# Patient Record
Sex: Female | Born: 1975 | Race: White | Hispanic: No | Marital: Married
Health system: Southern US, Community
[De-identification: ages and names within clinical notes are randomized; demographics above are authoritative.]

## PROBLEM LIST (undated history)

## (undated) DIAGNOSIS — Q059 Spina bifida, unspecified: Secondary | ICD-10-CM

## (undated) HISTORY — PX: SPINE SURGERY: SHX786

## (undated) HISTORY — PX: DILATION AND CURETTAGE OF UTERUS: SHX78

---

## 2010-04-18 ENCOUNTER — Ambulatory Visit (HOSPITAL_COMMUNITY): Admission: RE | Admit: 2010-04-18 | Discharge: 2010-04-18 | Payer: Self-pay | Admitting: Specialist

## 2011-09-13 IMAGING — US US OB DETAIL+14 WK
1 series · 14 of 28 positions shown · non-contrast
Comparison: none

OBSTETRICAL ULTRASOUND:
 This ultrasound was performed in The [HOSPITAL], and the [facility] report will be stored to [REDACTED] PACS.  This report is also available in [HOSPITAL]?s accessANYware.

[Series 1: us ob detail+14 wk · 85 acquisitions, 14 frames shown]
[im 4/85]
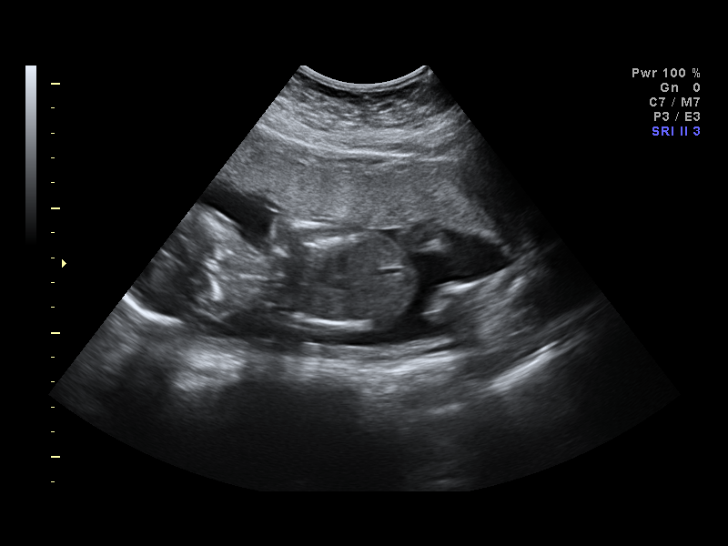
[im 10/85]
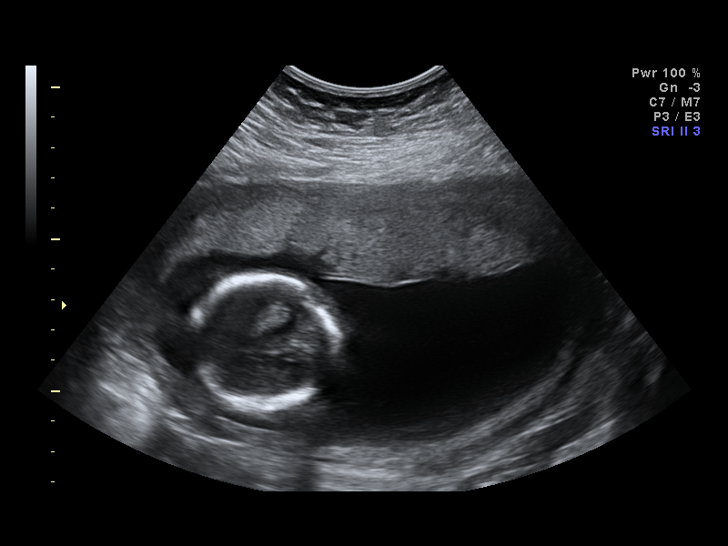
[im 16/85]
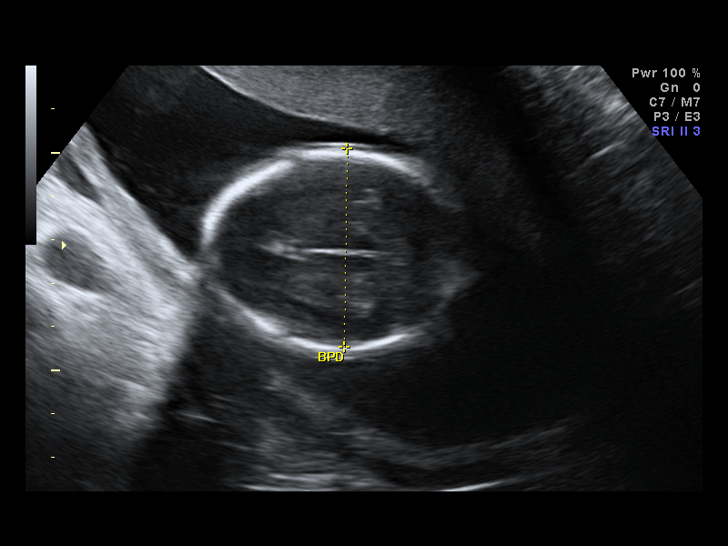
[im 22/85]
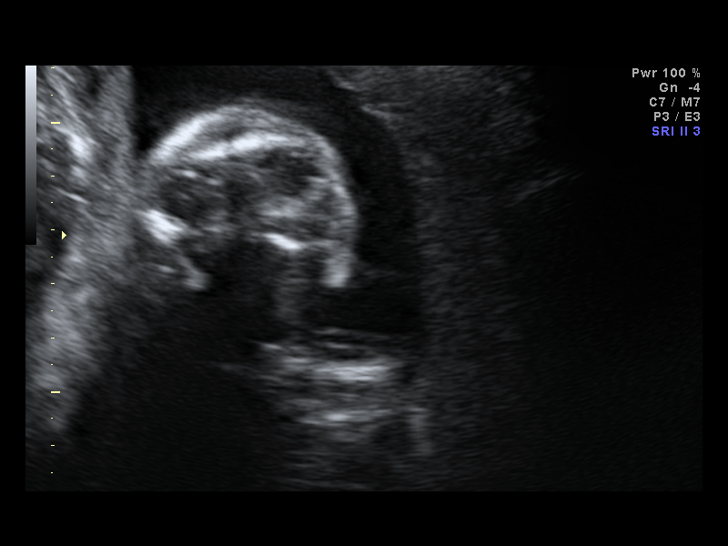
[im 29/85]
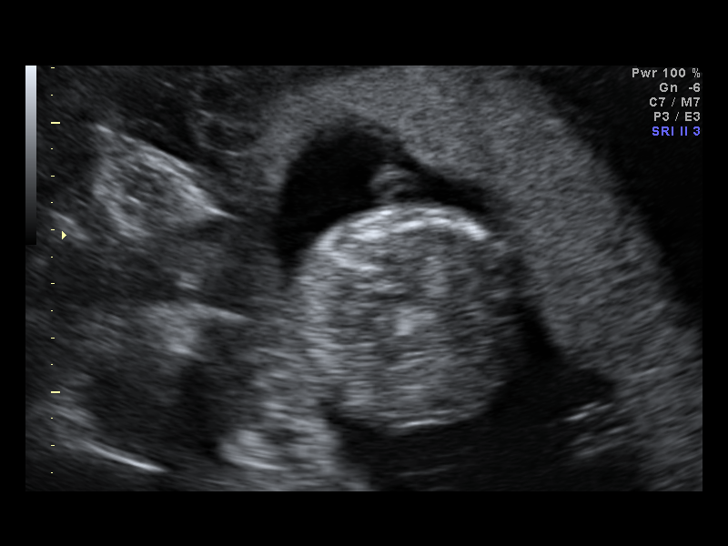
[im 35/85]
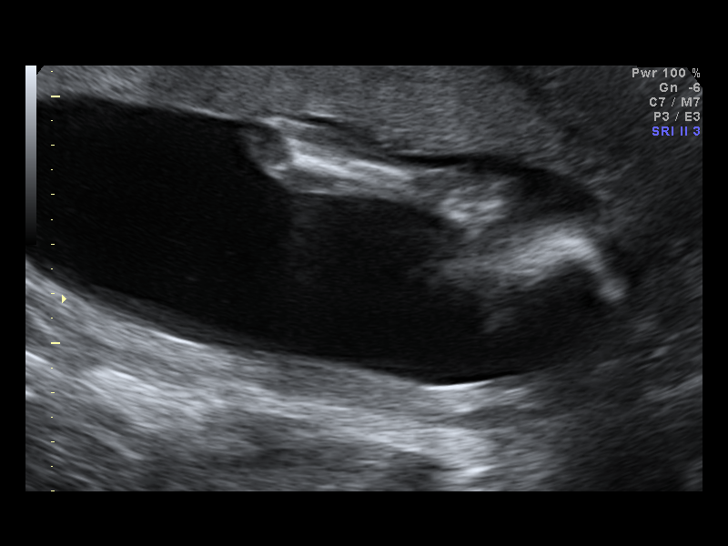
[im 41/85]
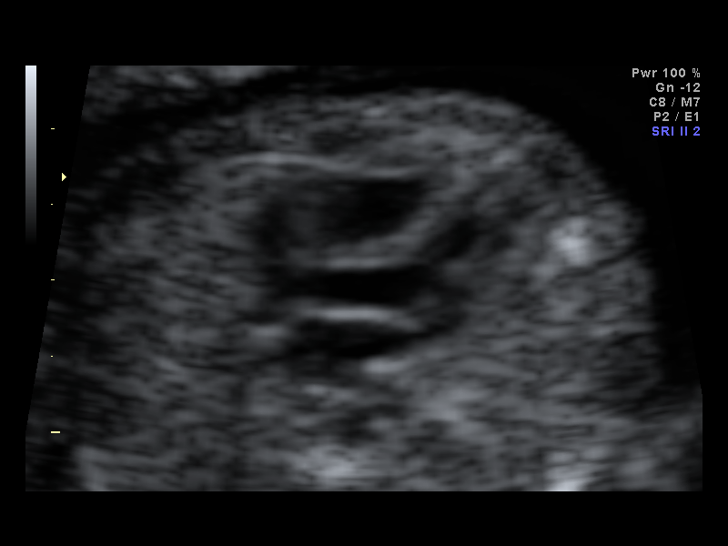
[im 47/85]
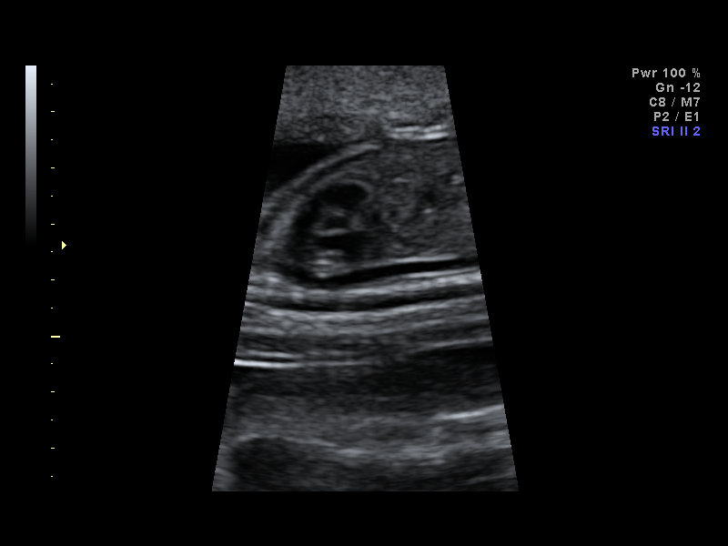
[im 53/85]
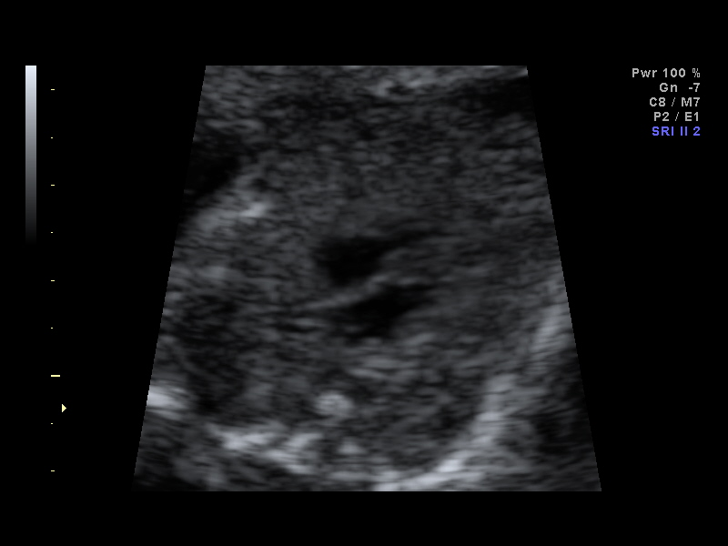
[im 60/85]
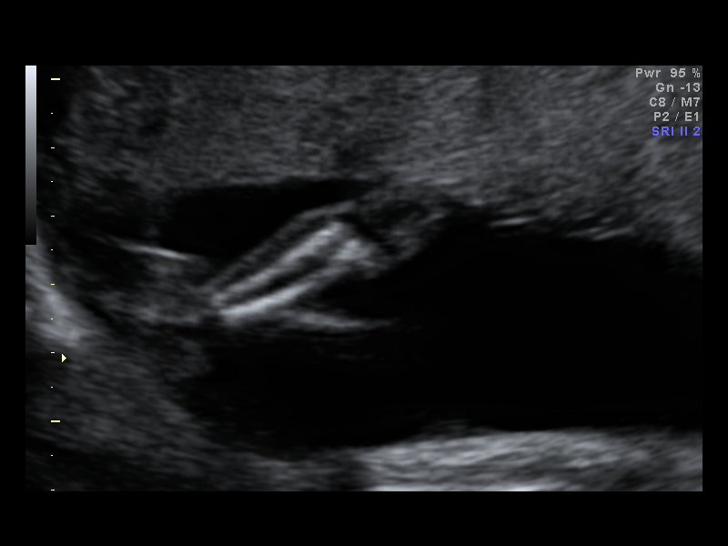
[im 66/85]
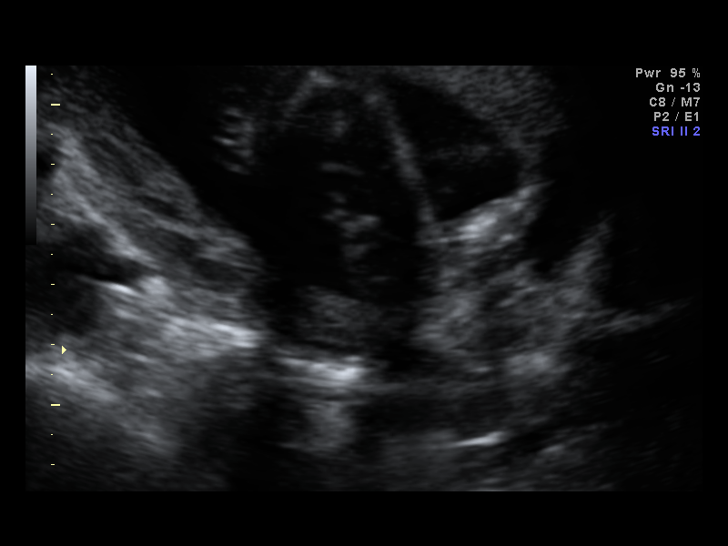
[im 72/85]
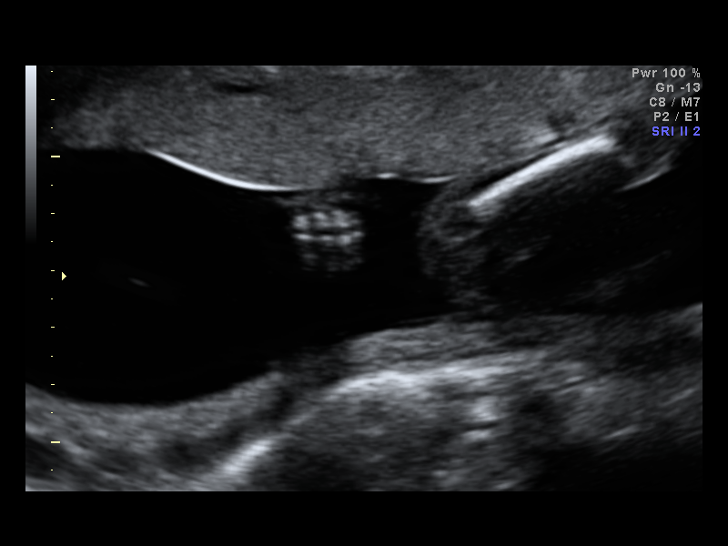
[im 78/85]
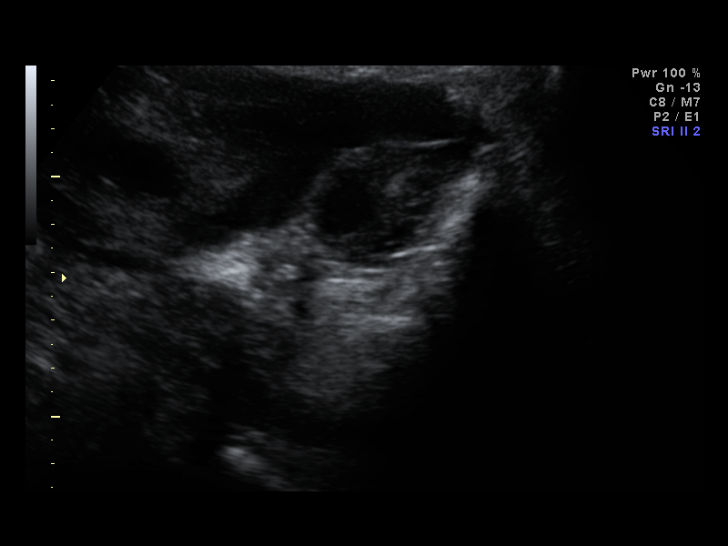
[im 85/85]
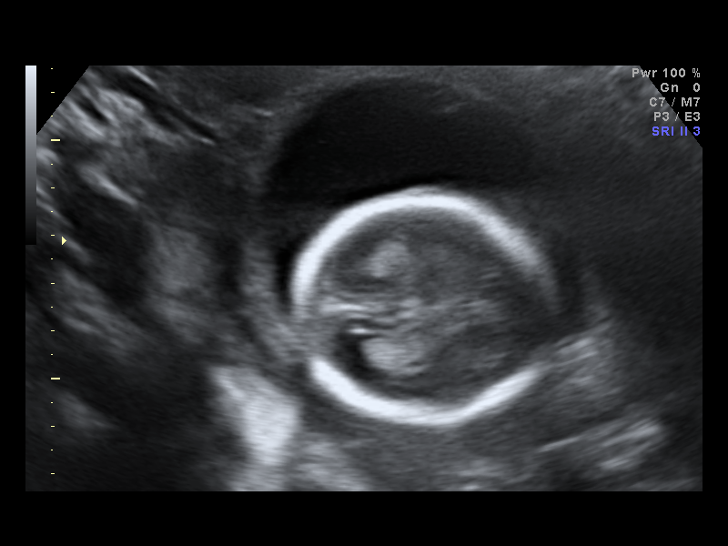

[14 of 28 positions shown; findings below may reference images not displayed]

IMPRESSION: [facility] has also been faxed to the ordering physician.

## 2014-03-14 ENCOUNTER — Other Ambulatory Visit: Payer: Self-pay

## 2014-04-13 ENCOUNTER — Other Ambulatory Visit (HOSPITAL_COMMUNITY): Payer: Self-pay | Admitting: Specialist

## 2014-04-13 ENCOUNTER — Encounter (HOSPITAL_COMMUNITY): Payer: Self-pay | Admitting: Specialist

## 2014-04-13 DIAGNOSIS — O09522 Supervision of elderly multigravida, second trimester: Secondary | ICD-10-CM

## 2014-04-13 DIAGNOSIS — Z3689 Encounter for other specified antenatal screening: Secondary | ICD-10-CM

## 2014-04-13 DIAGNOSIS — Q059 Spina bifida, unspecified: Secondary | ICD-10-CM

## 2014-05-26 ENCOUNTER — Other Ambulatory Visit (HOSPITAL_COMMUNITY): Payer: BC Managed Care – PPO

## 2014-05-26 ENCOUNTER — Encounter (HOSPITAL_COMMUNITY): Payer: Self-pay

## 2014-05-26 ENCOUNTER — Ambulatory Visit (HOSPITAL_COMMUNITY)
Admission: RE | Admit: 2014-05-26 | Discharge: 2014-05-26 | Disposition: A | Discharge status: Home / Self Care | Payer: BC Managed Care – PPO | Source: Ambulatory Visit | Attending: Specialist | Admitting: Specialist

## 2014-05-26 ENCOUNTER — Ambulatory Visit (HOSPITAL_COMMUNITY): Admission: RE | Admit: 2014-05-26 | Payer: BC Managed Care – PPO | Source: Ambulatory Visit

## 2014-05-26 DIAGNOSIS — Q059 Spina bifida, unspecified: Secondary | ICD-10-CM | POA: Diagnosis not present

## 2014-05-26 DIAGNOSIS — Z3689 Encounter for other specified antenatal screening: Secondary | ICD-10-CM

## 2014-05-26 DIAGNOSIS — O99891 Other specified diseases and conditions complicating pregnancy: Secondary | ICD-10-CM | POA: Diagnosis present

## 2014-05-26 DIAGNOSIS — O09522 Supervision of elderly multigravida, second trimester: Secondary | ICD-10-CM

## 2014-05-26 DIAGNOSIS — O09529 Supervision of elderly multigravida, unspecified trimester: Secondary | ICD-10-CM | POA: Diagnosis not present

## 2014-05-26 DIAGNOSIS — Z87891 Personal history of nicotine dependence: Secondary | ICD-10-CM | POA: Diagnosis not present

## 2014-05-26 DIAGNOSIS — O352XX1 Maternal care for (suspected) hereditary disease in fetus, fetus 1: Secondary | ICD-10-CM

## 2014-05-26 DIAGNOSIS — O9989 Other specified diseases and conditions complicating pregnancy, childbirth and the puerperium: Principal | ICD-10-CM

## 2014-05-26 HISTORY — DX: Spina bifida, unspecified: Q05.9

## 2014-05-26 NOTE — Progress Notes (Signed)
MATERNAL FETAL MEDICINE CONSULT  Patient Name: Angela GaudyValorie Stepanian Medical Record Number:  [id number] Date of Birth: December 10, 1975 Requesting Physician Name:  Jeannett SeniorAlice Newell, MD Date of Service: 05/26/2014  Chief Complaint Maternal spina bifida  History of Present Illness Angela Kelley was seen today secondary to maternal spina bifida at the request of Jeannett SeniorAlice Newell, MD.  The patient is a 38 y.o. [id number],at 624w5d with an EDD of 10/29/2014, by Last Menstrual Period dating method.  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  She has only modest motor deficits at this time, and reports her major ongoing problem is low back pain.  She has had two uncomplicated LTCS's at term.  She had general anesthesia for both operations, due to anesthesia's concern about providing her with neuraxial anesthesia in the setting on an ONTD.  [place]  Review of Systems Pertinent items are noted in HPI.  Patient History OB History  Gravida Para Term Preterm AB SAB TAB Ectopic Multiple Living  5 2 2  2 2    2     # Outcome Date GA Lbr Len/2nd Weight Sex Delivery Anes PTL Lv  5 CUR           4 SAB           3 SAB           2 TRM           1 TRM               Past Medical History  Diagnosis Date  . Spina bifida     Past Surgical History  Procedure Laterality Date  . Spine surgery    . Cesarean section    . Dilation and curettage of uterus      History   Social History  . Marital Status: Married    Spouse Name: N/A    Number of Children: N/A  . Years of Education: N/A   Social History Main Topics  . Smoking status: None  . Smokeless tobacco: None  . Alcohol Use: None  . Drug Use: None  . Sexual Activity: None   Other Topics Concern  . None   Social History Narrative  . None   Physical Examination Vitals- BP 114/63, Pulse 75, Weight 171 lbs. General appearance - alert, well appearing, and in no distress Abdomen - soft,  nontender, nondistended, no masses or organomegaly Extremities - no pedal edema noted, surgical scar noted on right foot.  Assessment and Recommendations 1.  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  As Angela Kelley has little in the way of functional deficits from the spina bifida and has already had two uncomplicated LTCS's at her local hospital, I do not have much to add to her plan of care.  I suspect she will have another uncomplicated pregnancy and repeat LTCS at term.  An anesthesia consult in advance of her scheduled repeat LTCS could be considered, but is not necessary [facility] are already familiar with Angela Kelley from her previous pregnancies.  The fetal spine was very clearly visualized and appears normal.  Thus, there is no evidence of a recurrent ONTD in her current fetus. 2.  Advanced Maternal Age.  Angela Kelley declined genetic counseling as she had it in previous pregnancies.  In addition, she had cell-free fetal DNA testing which was normal.  Nothing further is required as Ms. Cookrisk of aneuploidy  is too low to justify amniocentesis for confirmation of karyotype.  I spent 30 minutes with Angela Kelley today of which 50% was face-to-face counseling.  Thank you for referring Angela Kelley to the [facility].  Please do not hesitate to contact us with questions.   Rema Fendt, MD

## 2014-06-02 ENCOUNTER — Ambulatory Visit (HOSPITAL_COMMUNITY): Payer: BC Managed Care – PPO

## 2014-06-02 ENCOUNTER — Encounter (HOSPITAL_COMMUNITY): Payer: Self-pay

## 2014-06-06 ENCOUNTER — Encounter (HOSPITAL_COMMUNITY): Payer: Self-pay

## 2014-06-06 ENCOUNTER — Inpatient Hospital Stay (HOSPITAL_COMMUNITY): Admission: RE | Admit: 2014-06-06 | Payer: BC Managed Care – PPO | Source: Ambulatory Visit

## 2014-08-07 ENCOUNTER — Encounter (HOSPITAL_COMMUNITY): Payer: Self-pay

## 2015-03-31 ENCOUNTER — Encounter (HOSPITAL_COMMUNITY): Payer: Self-pay | Admitting: *Deleted

## 2015-10-21 IMAGING — US US OB DETAIL+14 WK
1 series · 16 of 28 positions shown · non-contrast
Comparison: none

[Series 1: us ob detail+14 wk · 0.20mm/px · 84 acquisitions, 16 frames shown]
[im 1/84]
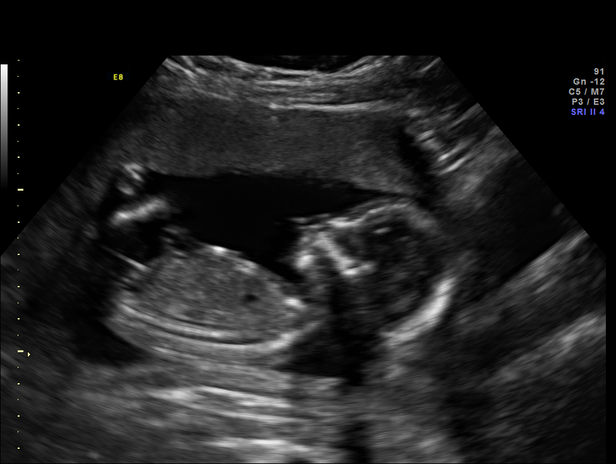
[im 7/84]
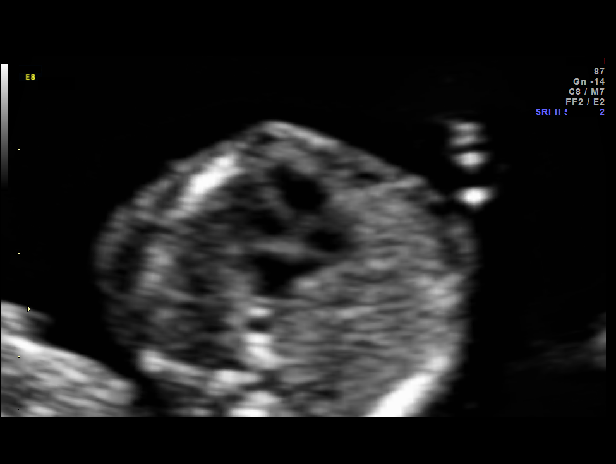
[im 13/84]
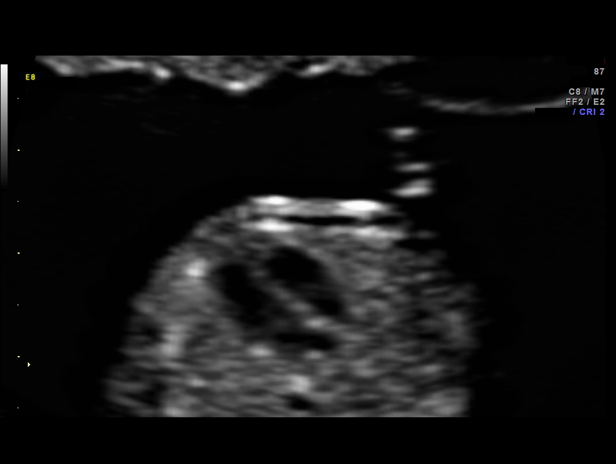
[im 19/84]
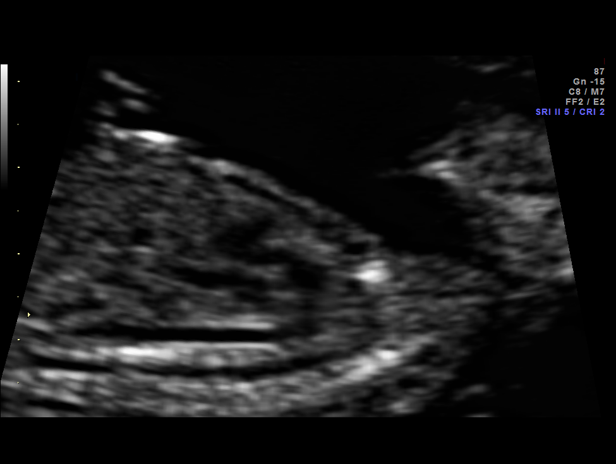
[im 22/84]
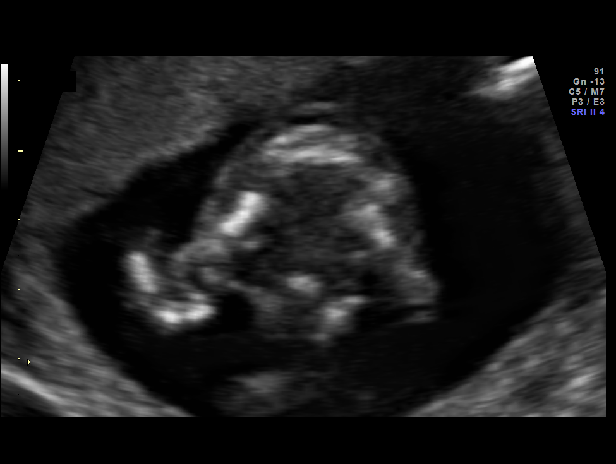
[im 28/84]
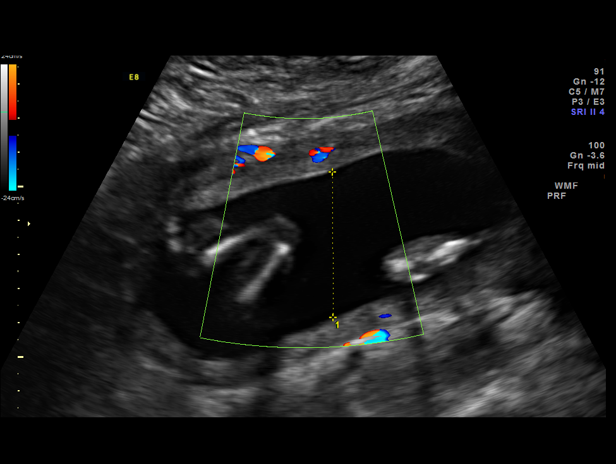
[im 34/84]
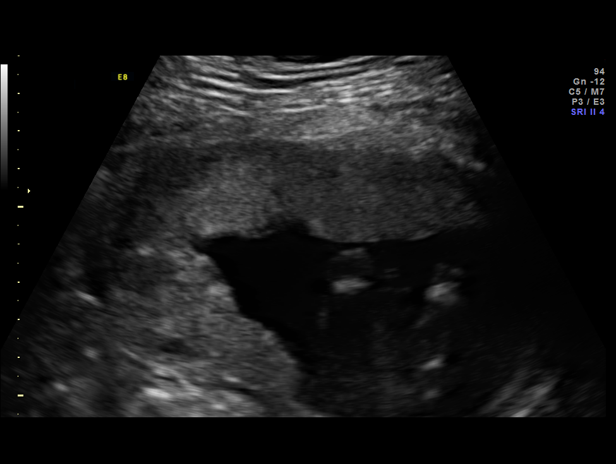
[im 40/84]
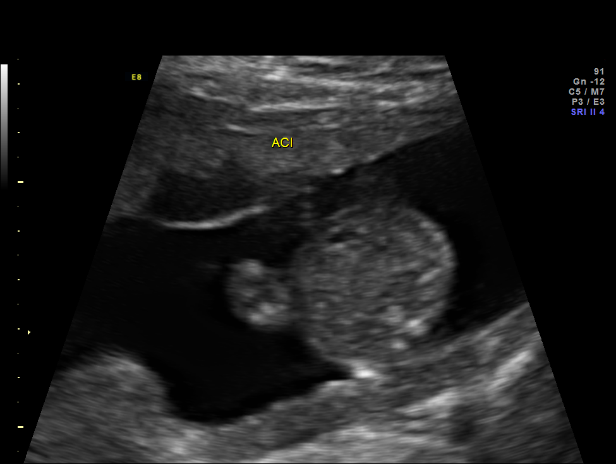
[im 44/84]
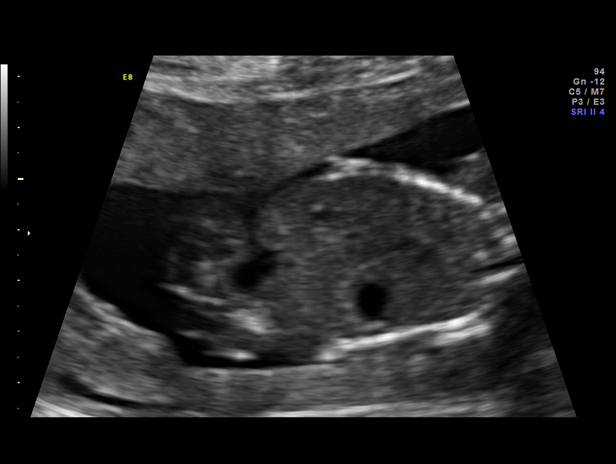
[im 50/84]
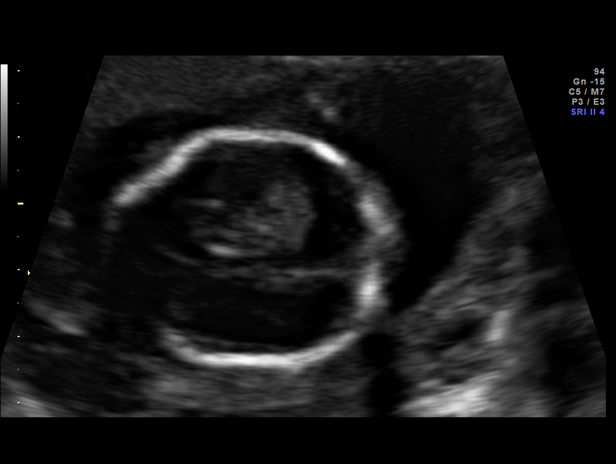
[im 56/84]
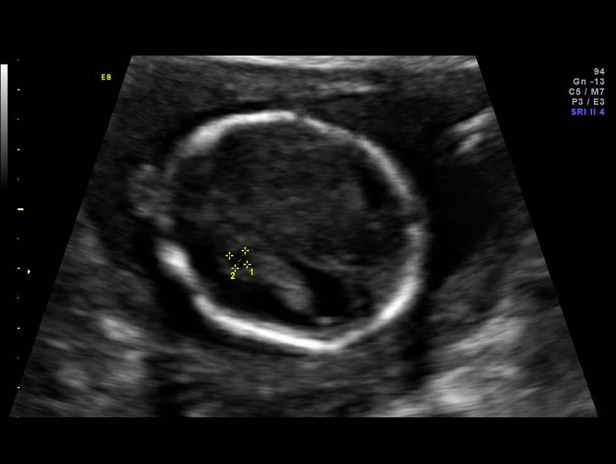
[im 62/84]
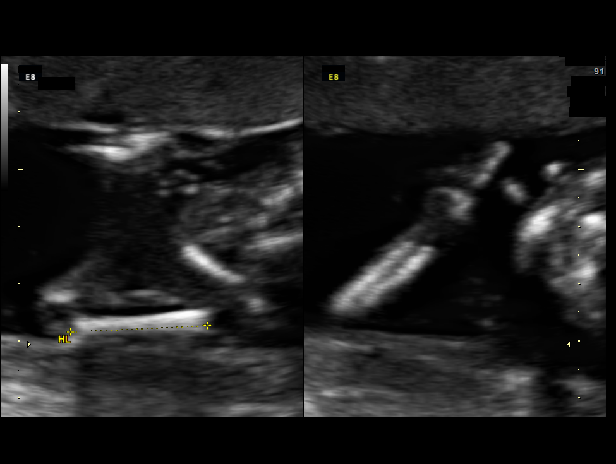
[im 65/84]
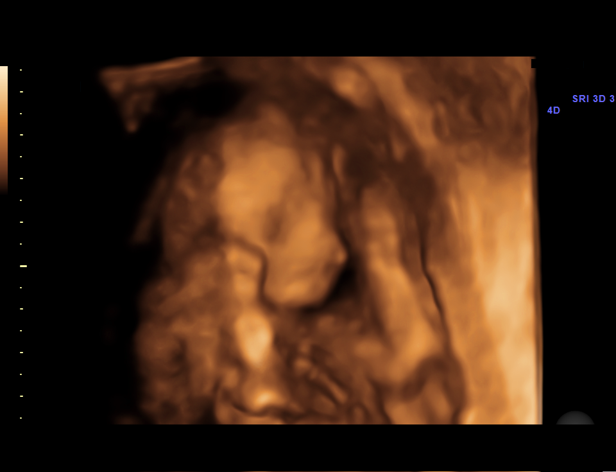
[im 71/84]
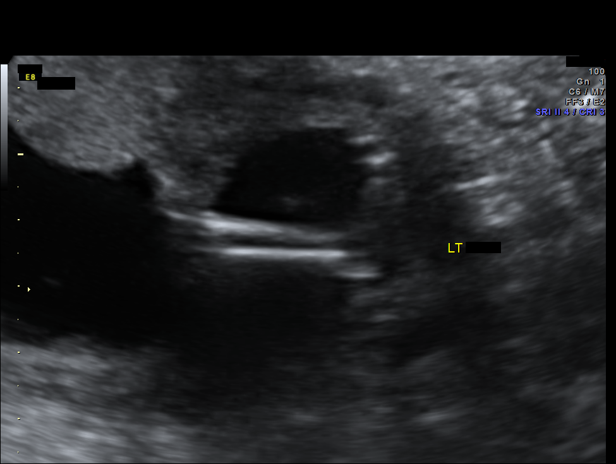
[im 77/84]
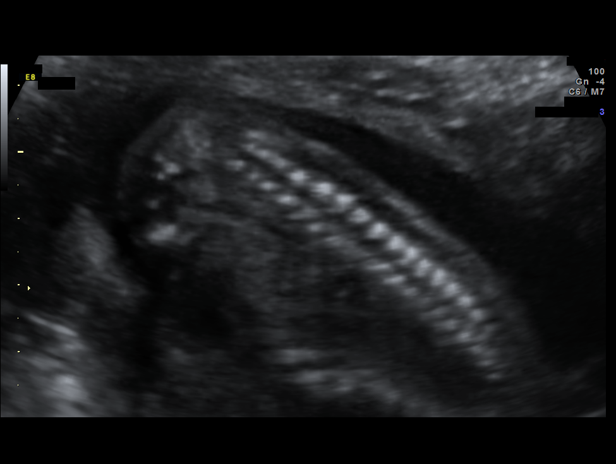
[im 84/84]
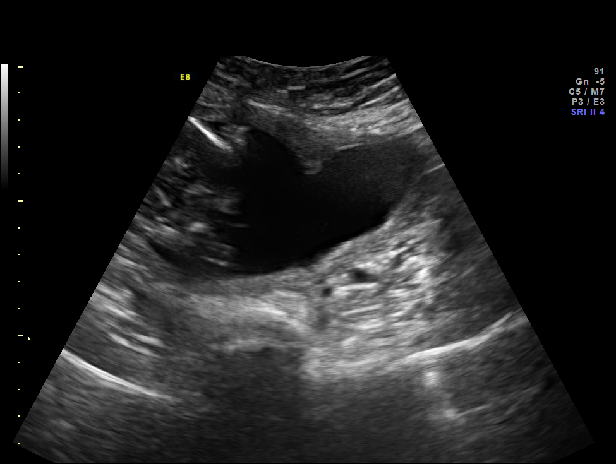

[16 of 28 positions shown; findings below may reference images not displayed]

OBSTETRICS REPORT
                      (Signed Final 05/26/2014 [DATE])

Service(s) Provided

 US OB DETAIL + 14 WK                                  76811.0
Indications

 Detailed fetal anatomic survey
 History of genetic / anatomic abnormality - [place]
 bifida (patient)
 Advanced maternal age (AMA), Multigravida - low
 risk NIPS
Fetal Evaluation

 Num Of Fetuses:    1
 Fetal Heart Rate:  153                          bpm
 Cardiac Activity:  Observed
 Presentation:      Cephalic
 Placenta:          Anterior, above cervical os
 P. Cord            Visualized, central
 Insertion:

 Amniotic Fluid
 AFI FV:      Subjectively within normal limits
                                             Larg Pckt:     4.2  cm
Biometry

 BPD:     37.7  mm     G. Age:  17w 4d                CI:         79.5   70 - 86
 OFD:     47.4  mm                                    FL/HC:      17.0   15.8 -
                                                                         18
 HC:     137.8  mm     G. Age:  17w 1d       18  %    HC/AC:      1.20   1.07 -

 AC:     114.9  mm     G. Age:  17w 2d       34  %    FL/BPD:
 FL:      23.4  mm     G. Age:  17w 0d       22  %    FL/AC:      20.4   20 - 24
 HUM:     23.1  mm     G. Age:  17w 1d       38  %
 CER:     16.5  mm     G. Age:  16w 4d       23  %
 NFT:      3.2  mm

 Est. FW:     184  gm      0 lb 6 oz     39  %
Gestational Age

 LMP:           17w 5d        Date:  01/22/14                 EDD:   10/29/14
 U/S Today:     17w 2d                                        EDD:   11/01/14
 Best:          17w 5d     Det. By:  LMP  (01/22/14)          EDD:   10/29/14
Anatomy

 Cranium:          Appears normal         Aortic Arch:      Appears normal
 Fetal Cavum:      Appears normal         Ductal Arch:      Appears normal
 Ventricles:       Appears normal         Diaphragm:        Appears normal
 Choroid Plexus:   Left choroid           Stomach:          Appears normal, left
                   plexus cyst, 3 mm
                                                            sided
 Cerebellum:       Appears normal         Abdomen:          Appears normal
 Posterior Fossa:  Appears normal         Abdominal Wall:   Appears nml (cord
                                                            insert, abd wall)
 Nuchal Fold:      Appears normal         Cord Vessels:     Appears normal (3
                                                            vessel cord)
 Face:             Appears normal         Kidneys:          Appear normal
                   (orbits and profile)
 Lips:             Appears normal         Bladder:          Appears normal
 Heart:            Appears normal         Spine:            Not well visualized
                   (4CH, axis, and
                   situs)
 RVOT:             Appears normal         Lower             Appears normal
                                          Extremities:
 LVOT:             Appears normal         Upper             Appears normal
                                          Extremities:

 Other:  Fetus appears to be a female. Heels and 5th digit visualized. Nasal
         bone visualized. Technically difficult due to fetal position.
Targeted Anatomy

 Fetal Central Nervous System
 Cisterna Magna:
Cervix Uterus Adnexa

 Cervical Length:    2.9      cm

 Cervix:       Normal appearance by transabdominal scan.

 Adnexa:     No abnormality visualized.
Comments

 The patient's fetal anatomic survey is now complete.  A left
 sided choroid plexus cyst was noted.  This is a common
 variant (1-2%) of all pregnancies, and carries a small
 association with Trisomy 18.  However, there were no other
 ultrasound stigmata of Trisomy 18, making the possibility of
 Trisomy 18 very unlikely especially considering that Ms. Locklear
 had normal cell-free fetal DNA testing.  No other fetal
 anomalies or soft markers of aneuploidy were seen.  Fetal
 spine anatomy was clearly visualized and was normal.  In
 addition, there were none of the intracranial findings typically
 associated with an open neural tube defect, Getoart Helbl sign
 and banana sign.  Results of scan discussed with patient, as
 well as the limitations of U/S to detect all fetal anomalies and
 aneuploidies.  No further ultrasounds are required unless
 additional problems arise.
Impression

 Single living intrauterine pregnancy at 17 weeks 5 days.
 Appropriate fetal growth (39%).
 Normal amniotic fluid volume.
 Left sided choroid plexus cyst.
 Otherwise normal fetal anatomy.
 Noother  fetal anomalies or soft markers of aneuploidy seen.
Recommendations

 Follow-up ultrasounds as clinically indicated.

 questions or concerns.
                [place], Md Malak
